# Patient Record
Sex: Female | Born: 2000 | Marital: Single
Health system: Southern US, Community
[De-identification: ages and names within clinical notes are randomized; demographics above are authoritative.]

---

## 2021-03-21 ENCOUNTER — Ambulatory Visit (INDEPENDENT_AMBULATORY_CARE_PROVIDER_SITE_OTHER): Payer: 59 | Admitting: Orthopedic Surgery

## 2021-03-21 DIAGNOSIS — S43431A Superior glenoid labrum lesion of right shoulder, initial encounter: Secondary | ICD-10-CM

## 2021-03-25 ENCOUNTER — Other Ambulatory Visit: Payer: No Typology Code available for payment source

## 2021-03-25 DIAGNOSIS — M25511 Pain in right shoulder: Secondary | ICD-10-CM

## 2021-03-27 ENCOUNTER — Encounter: Payer: Self-pay | Admitting: Orthopedic Surgery

## 2021-03-27 NOTE — Progress Notes (Signed)
   Post-Op Visit Note   Patient: Deborah Roy           Date of Birth: 2001-02-13           MRN: [id number] Visit Date: 03/21/2021 PCP: No primary care provider on file.   Assessment & Plan:  Chief Complaint: No chief complaint on file.  Visit Diagnoses:  1. 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  Injured her right shoulder about 3 months ago.  Developed pain without discrete injury.  Pain has been getting worse.  She describes new mechanical symptoms.  She is had pain with serving as well as recently when she has a forehand.  Takes ibuprofen without much relief.  Flat surface worsen her kicks her.  Impact hurts on the forehand.  Hard for her to sleep on the right-hand side.  On examination patient has positive O'Brien's test with a little grinding that she can do with voluntary contraction of some of the shoulder muscles.  Does not really seem like it is coming from the scapular region.  Rotator cuff strength is intact.  AC joint nontender.  Range of motion full and patient has negative apprehension and slightly less than a centimeter sulcus sign bilaterally.  Impression is probable right shoulder SLAP tear based primarily on history as well as how much is hurting her with her served in for him.  She wants to try to finish out the season.  We discussed symptomatic relief with intra-articular injection which she wants to hold off on.  We will get MRI arthrogram of the right shoulder to evaluate for labral tear.  I would say at this point activity as tolerated.  She has been playing with this for several months but it is getting worse.  See what the scan shows.  Follow-Up Instructions: No follow-ups on file.   Orders:  No orders of the defined types were placed in this encounter.  No orders of the defined types were placed in this encounter.   Imaging: No results found.  PMFS History: There are no problems  to display for this patient.  History reviewed. No pertinent past medical history.  History reviewed. No pertinent family history.  History reviewed. No pertinent surgical history. Social History   Occupational History  . Not on file  Tobacco Use  . Smoking status: Not on file  . Smokeless tobacco: Not on file  Substance and Sexual Activity  . Alcohol use: Not on file  . Drug use: Not on file  . Sexual activity: Not on file

## 2021-04-20 ENCOUNTER — Inpatient Hospital Stay: Admission: RE | Admit: 2021-04-20 | Payer: No Typology Code available for payment source | Source: Ambulatory Visit

## 2021-04-20 ENCOUNTER — Other Ambulatory Visit: Payer: No Typology Code available for payment source

## 2021-11-14 ENCOUNTER — Ambulatory Visit (INDEPENDENT_AMBULATORY_CARE_PROVIDER_SITE_OTHER): Payer: Self-pay | Admitting: Orthopedic Surgery

## 2021-11-14 DIAGNOSIS — S43431A Superior glenoid labrum lesion of right shoulder, initial encounter: Secondary | ICD-10-CM

## 2021-11-16 ENCOUNTER — Other Ambulatory Visit: Payer: Self-pay

## 2021-11-16 DIAGNOSIS — M25511 Pain in right shoulder: Secondary | ICD-10-CM

## 2021-11-16 NOTE — Progress Notes (Signed)
MR arthrogram shoulder ordered per Dr August Saucer

## 2021-11-19 ENCOUNTER — Encounter: Payer: Self-pay | Admitting: Orthopedic Surgery

## 2021-11-19 NOTE — Progress Notes (Signed)
   Post-Op Visit Note   Patient: Deborah Roy           Date of Birth: 2001/06/09           MRN: [id number] Visit Date: 11/14/2021 PCP: No primary care provider on file.   Assessment & Plan:  Chief Complaint: No chief complaint on file.  Visit Diagnoses:  1. 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  She has had shoulder pain for about a year and a half now.  Last season she was able to moderate the pain with activity modification and anti-inflammatory medication.  Got better in the summer with a less demanding playing schedule but recurred this fall.  Has pain with overhead motion including serving and overhead's.  For hands and back hands are generally okay for her.  She does report mechanical symptoms in the right shoulder.  Has difficulty with overhead weight lifting as well.  Is affecting her serve velocity.  She has had extensive course of rehabilitation as well as anti-inflammatory medication.  Presents now for further evaluation and management.  Plain radiographs are unremarkable for any bony pathology.  On examination she has full active and passive range of motion of the shoulder.  She does have a little bit of coarseness with labral load testing on the right compared to the left.  No radicular signs in that right arm.  O'Brien's testing equivocal on the right negative on the left.  No discrete AC joint tenderness with crossarm adduction.  No other masses lymphadenopathy or skin changes noted in that shoulder girdle region.  Impression is right shoulder SLAP tear with further progression of symptoms unresponsive to nonoperative management.  Plan is MRI arthrogram of the right shoulder to evaluate for posterior superior labral pathology.  Follow-up after that study.  Follow-Up Instructions: Return for after MRI.   Orders:  No orders of the defined types were placed in this  encounter.  No orders of the defined types were placed in this encounter.   Imaging: No results found.  PMFS History: There are no problems to display for this patient.  No past medical history on file.  No family history on file.  No past surgical history on file. Social History   Occupational History   Not on file  Tobacco Use   Smoking status: Not on file   Smokeless tobacco: Not on file  Substance and Sexual Activity   Alcohol use: Not on file   Drug use: Not on file   Sexual activity: Not on file

## 2021-12-23 ENCOUNTER — Other Ambulatory Visit: Payer: BC Managed Care – PPO

## 2021-12-23 ENCOUNTER — Inpatient Hospital Stay: Admission: RE | Admit: 2021-12-23 | Payer: BC Managed Care – PPO | Source: Ambulatory Visit

## 2021-12-28 ENCOUNTER — Ambulatory Visit: Payer: BC Managed Care – PPO | Admitting: Orthopedic Surgery

## 2021-12-29 ENCOUNTER — Ambulatory Visit
Admission: RE | Admit: 2021-12-29 | Discharge: 2021-12-29 | Disposition: A | Discharge status: Home / Self Care | Payer: BC Managed Care – PPO | Source: Ambulatory Visit | Attending: Orthopedic Surgery | Admitting: Orthopedic Surgery

## 2021-12-29 ENCOUNTER — Other Ambulatory Visit: Payer: Self-pay

## 2021-12-29 DIAGNOSIS — M25511 Pain in right shoulder: Secondary | ICD-10-CM

## 2021-12-29 IMAGING — MR MR SHOULDER*R* W/CM
4 of 6 series · 24 of 40 positions shown · IV contrast (agent unspecified)
Comparison: None.

CLINICAL DATA: Right sharp shoulder pain with overhead movements
for 6 months. Clinical concern superior labrum anterior and
posterior tear.

EXAM:
MR ARTHROGRAM OF THE RIGHT SHOULDER
TECHNIQUE: Multiplanar, multisequence MR imaging of the right shoulder was
performed following the administration of intra-articular contrast.
CONTRAST:  See Injection Documentation.

[Series 3: (id) fs · axial · 4.0mm · 0.27mm/px · z∈[-41,+51]mm · 7 of 20 slices shown]
[im 1/20]
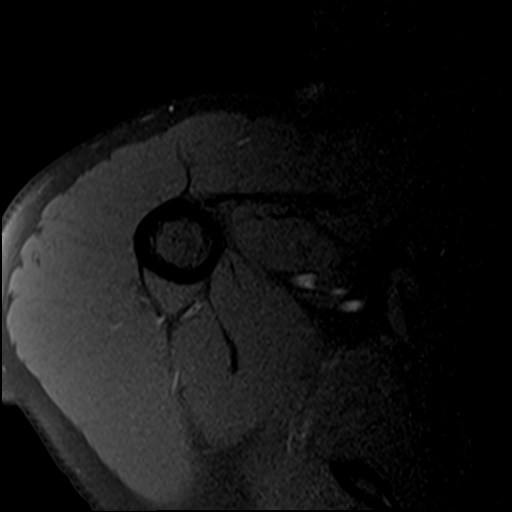
[im 4/20]
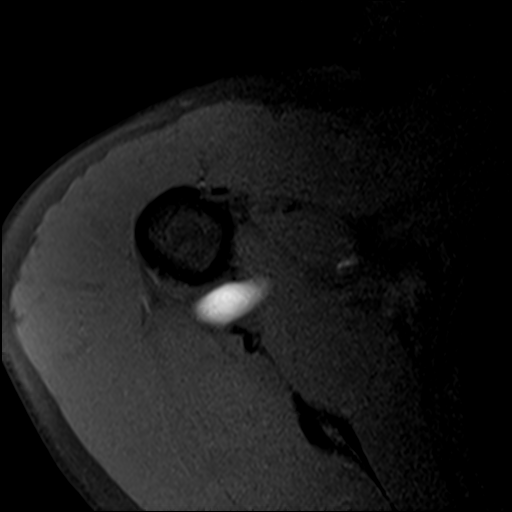
[im 7/20]
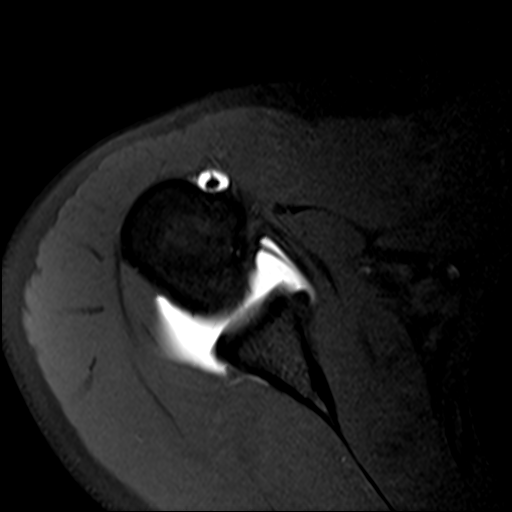
[im 10/20]
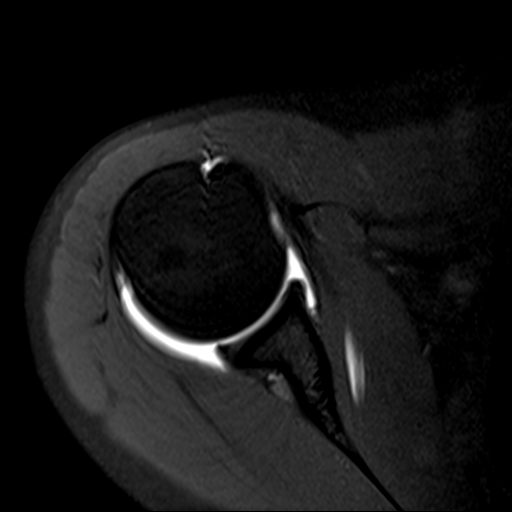
[im 13/20]
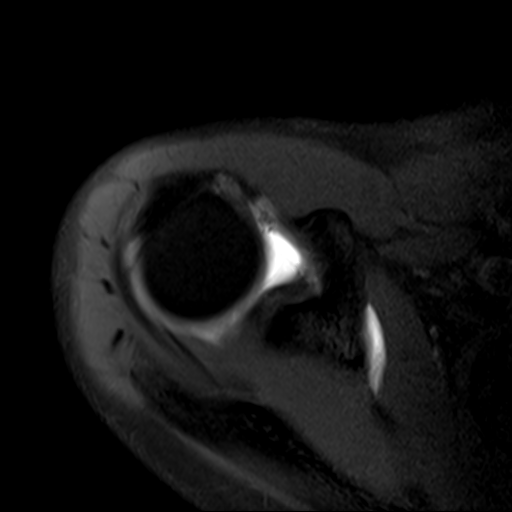
[im 16/20]
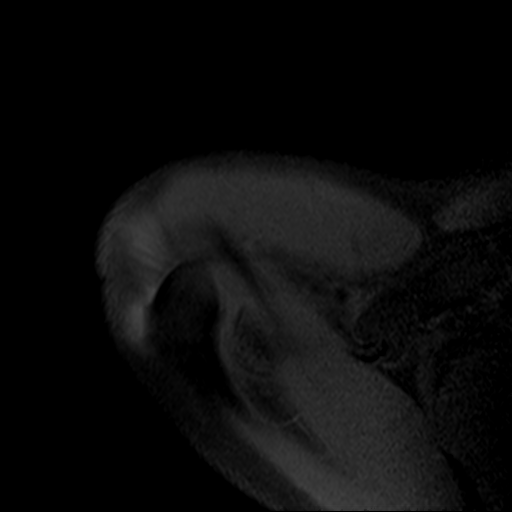
[im 20/20]
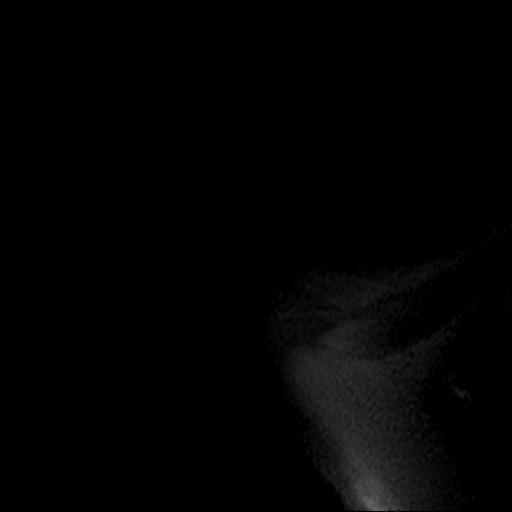

[Series 4: T1 fat-sat · sagittal · 4.0mm · 0.27mm/px · 6 of 16 slices shown]
[im 1/16]
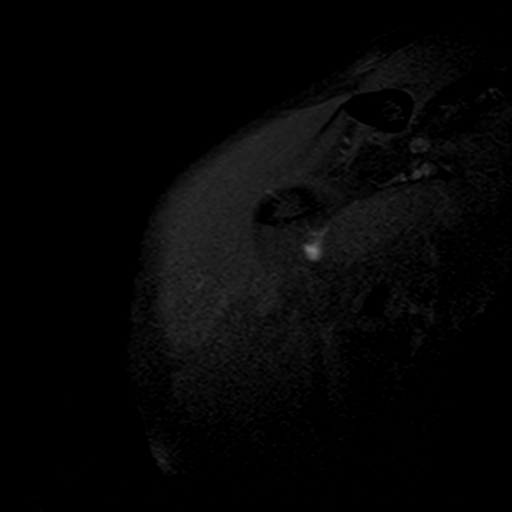
[im 4/16]
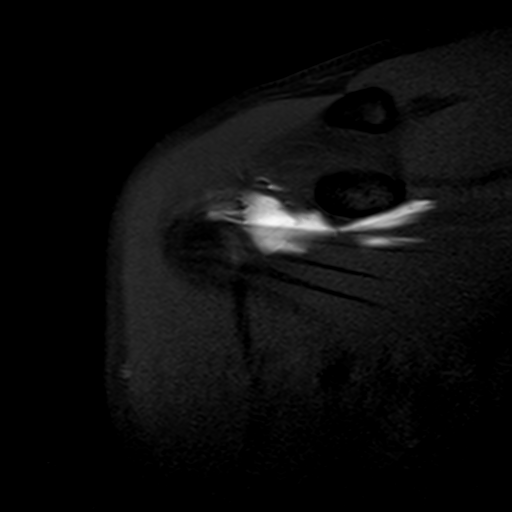
[im 7/16]
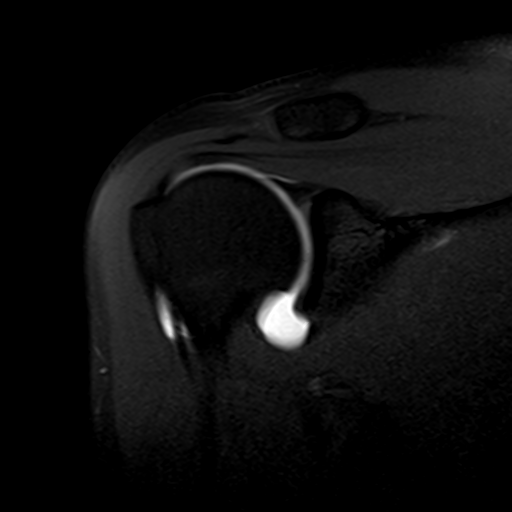
[im 10/16]
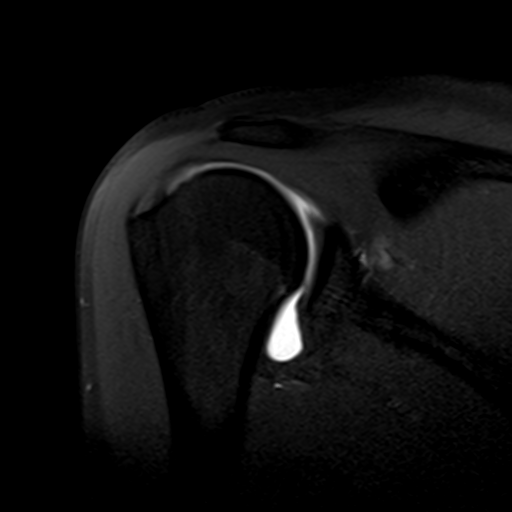
[im 13/16]
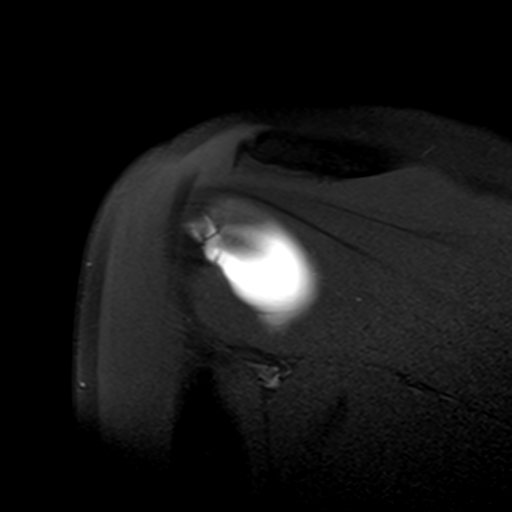
[im 16/16]
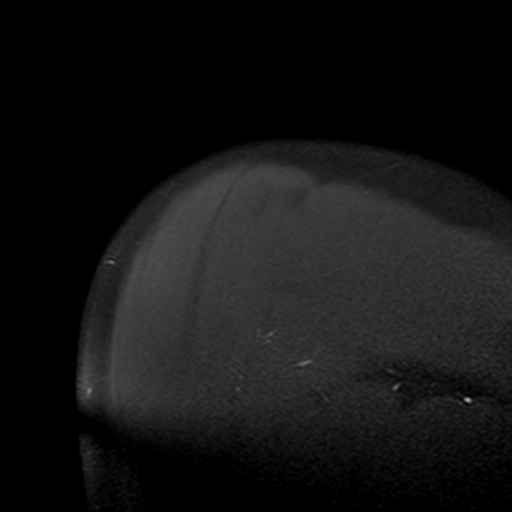

[Series 5: t2_tse_cor · sagittal · 4.0mm · 0.27mm/px · 4 of 17 slices shown]
[im 1/17]
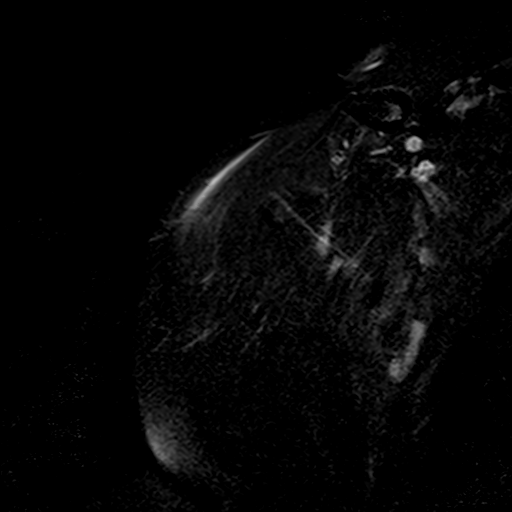
[im 3/17]
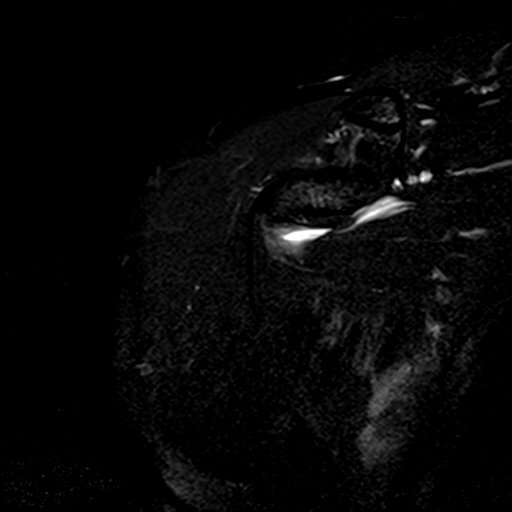
[im 9/17]
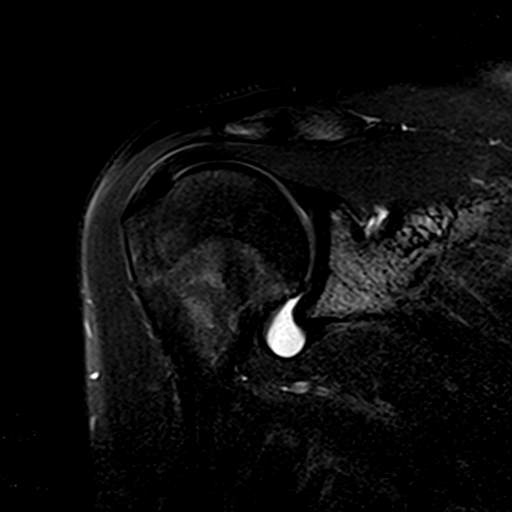
[im 14/17]
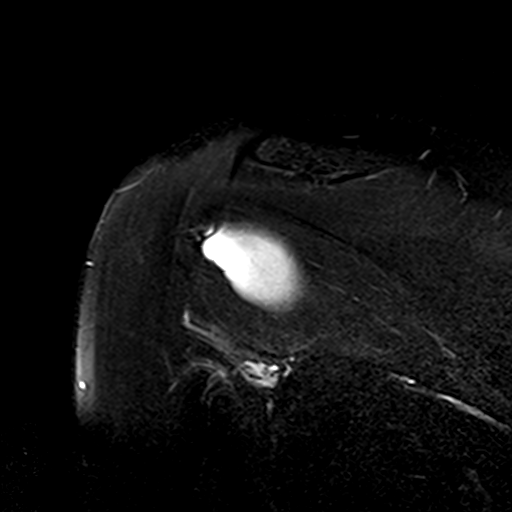

[Series 6: T1 · sagittal · 4.0mm · 0.55mm/px · 7 of 17 slices shown]
[im 1/17]
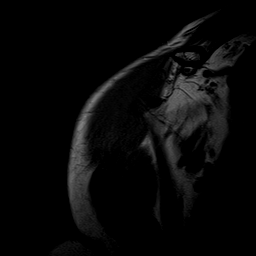
[im 3/17]
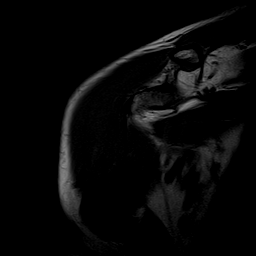
[im 6/17]
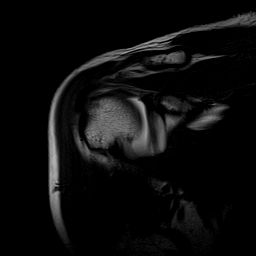
[im 9/17]
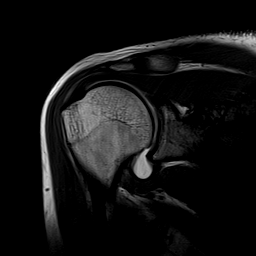
[im 11/17]
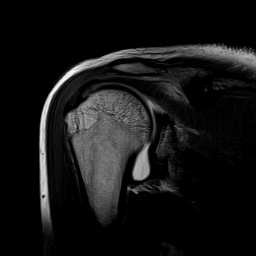
[im 14/17]
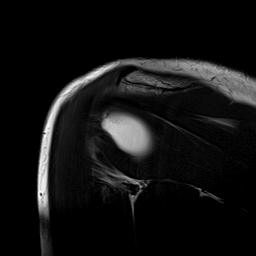
[im 17/17]
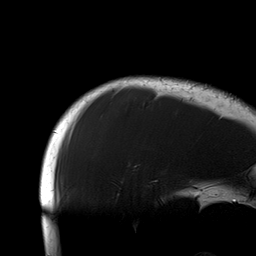

[24 of 40 positions shown; findings below may reference images not displayed]

FINDINGS: Rotator cuff: The supraspinatus, infraspinatus, subscapularis, and
teres minor tendons are intact.

Muscles: Preserved bulk and signal intensity of the rotator cuff
musculature without edema, atrophy, or fatty infiltration.

Biceps long head:  Intact and appropriately positioned.

Acromioclavicular Joint: Normal AC joint. Trace
subacromial-subdeltoid bursal fluid. No contrast extends into the
bursal space.

Glenohumeral Joint: Adequately distended with injected contrast.
Relatively patulous joint capsule. No cartilage defect.

Labrum:  No evidence of labral tear. No paralabral cyst.

Bones: No acute fracture. No dislocation. Normal appearance of the
glenoid without evidence of glenoid hypoplasia. No bone marrow
edema. No marrow replacing bone lesion.

Other: None.
IMPRESSION: 1. Essentially unremarkable MRI of the right shoulder. Intact
rotator cuff, biceps tendon, and labrum.
2. Trace subacromial-subdeltoid bursal fluid, which may reflect a
mild bursitis.
3. Relatively patulous joint capsule which may be secondary to
over-distension, although can be associated with glenohumeral
instability.

## 2021-12-29 MED ORDER — IOPAMIDOL (ISOVUE-M 200) INJECTION 41%
13.0000 mL | Freq: Once | INTRAMUSCULAR | Status: AC
  Administered 2021-12-29: 13 mL via INTRA_ARTICULAR

## 2021-12-31 NOTE — Progress Notes (Signed)
Sent info to trainer

## 2022-01-02 ENCOUNTER — Ambulatory Visit: Payer: BC Managed Care – PPO | Admitting: Orthopedic Surgery

## 2022-01-30 ENCOUNTER — Ambulatory Visit (INDEPENDENT_AMBULATORY_CARE_PROVIDER_SITE_OTHER): Payer: Self-pay | Admitting: Orthopedic Surgery

## 2022-01-30 DIAGNOSIS — M25571 Pain in right ankle and joints of right foot: Secondary | ICD-10-CM

## 2022-01-31 ENCOUNTER — Other Ambulatory Visit: Payer: Self-pay

## 2022-01-31 ENCOUNTER — Ambulatory Visit: Payer: Self-pay

## 2022-01-31 ENCOUNTER — Ambulatory Visit: Payer: BC Managed Care – PPO

## 2022-01-31 DIAGNOSIS — M79671 Pain in right foot: Secondary | ICD-10-CM

## 2022-01-31 LAB — VITAMIN D 25 HYDROXY (VIT D DEFICIENCY, FRACTURES): Vit D, 25-Hydroxy: 28 ng/mL — ABNORMAL LOW (ref 30–100)

## 2022-02-01 ENCOUNTER — Other Ambulatory Visit: Payer: Self-pay

## 2022-02-01 ENCOUNTER — Ambulatory Visit
Admission: RE | Admit: 2022-02-01 | Discharge: 2022-02-01 | Disposition: A | Discharge status: Home / Self Care | Payer: BC Managed Care – PPO | Source: Ambulatory Visit | Attending: Orthopedic Surgery | Admitting: Orthopedic Surgery

## 2022-02-01 DIAGNOSIS — M79671 Pain in right foot: Secondary | ICD-10-CM

## 2022-02-01 IMAGING — MR MR FOOT*R* W/O CM
5 series · 40 of 40 positions shown · non-contrast
Comparison: None.

CLINICAL DATA: Evaluate base of the second metatarsal fracture
versus Lisfranc ligament injury.

EXAM:
MRI OF THE RIGHT FOREFOOT WITHOUT CONTRAST
TECHNIQUE: Multiplanar, multisequence MR imaging of the right foot was
performed. No intravenous contrast was administered.

[Series 4: T1 · coronal · left · 3.0mm · 0.38mm/px · 12 of 54 slices shown (1 of 2)]
[im 1/54]
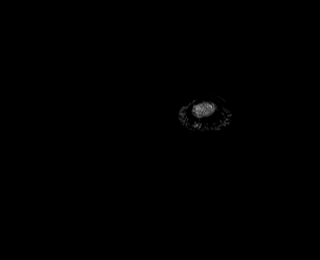
[im 5/54]
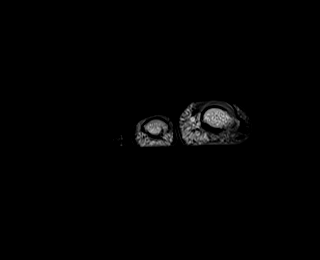
[im 10/54]
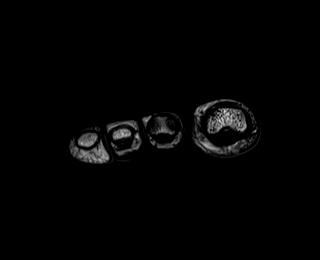
[im 15/54]
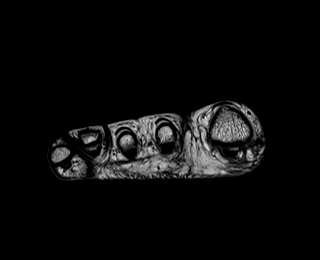
[im 20/54]
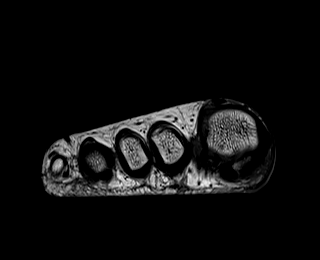
[im 25/54]
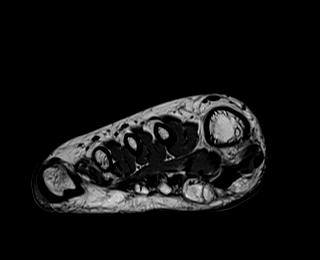
[im 29/54]
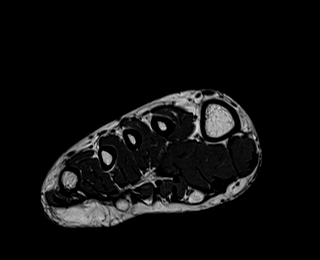
[im 34/54]
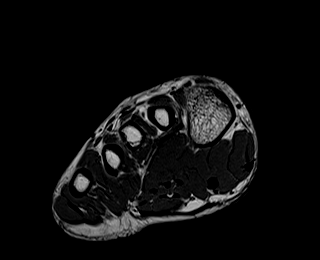
[im 39/54]
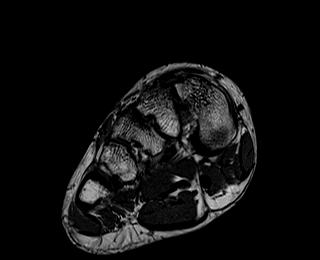
[im 44/54]
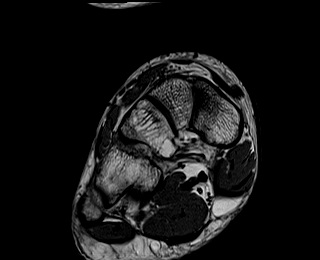
[im 49/54]
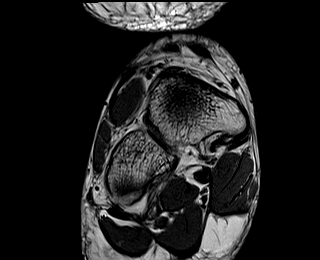
[im 54/54]
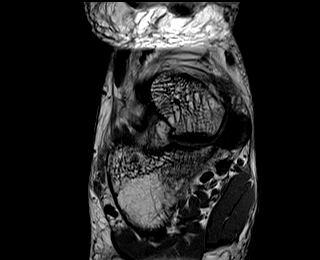

[Series 5: T2 fat-sat · coronal · left · 3.0mm · 0.38mm/px · 12 of 54 slices shown (1 of 2)]
[im 1/54]
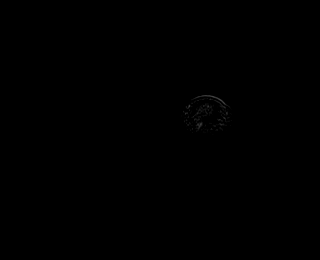
[im 5/54]
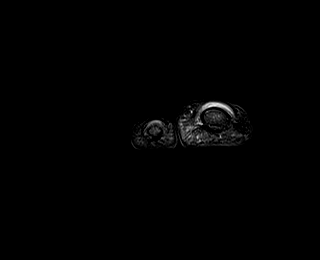
[im 10/54]
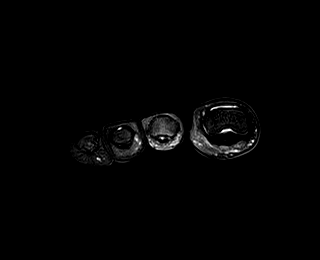
[im 15/54]
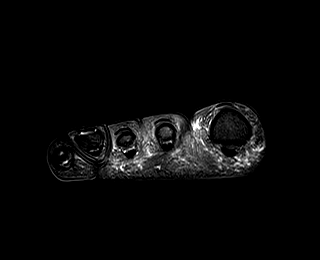
[im 20/54]
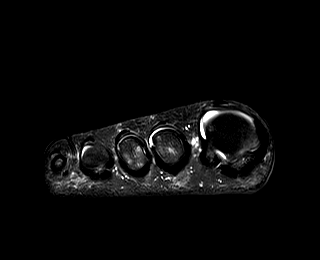
[im 25/54]
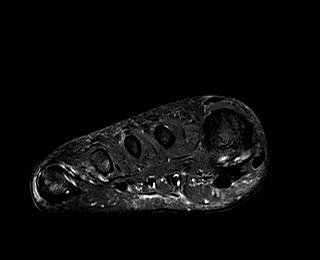
[im 29/54]
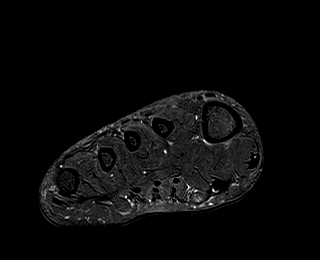
[im 34/54]
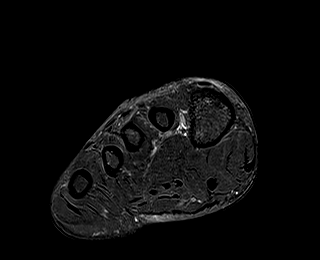
[im 39/54]
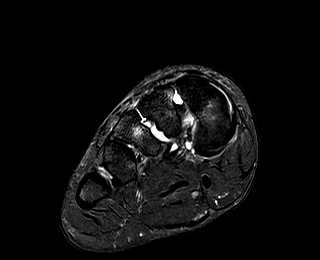
[im 44/54]
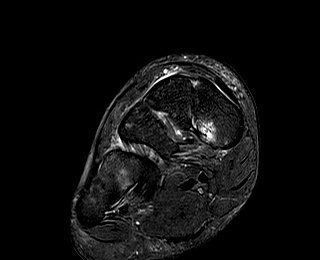
[im 49/54]
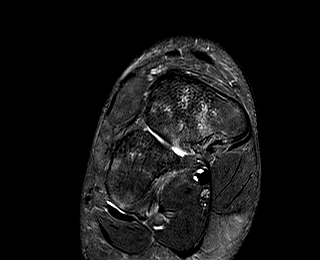
[im 54/54]
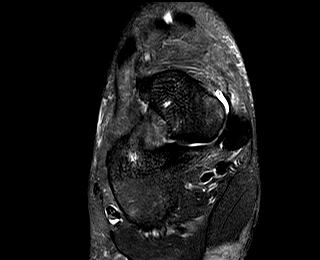

[Series 7: STIR · sagittal · left · 3.0mm · 0.78mm/px · 6 of 29 slices shown]
[im 1/29]
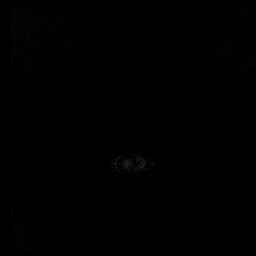
[im 6/29]
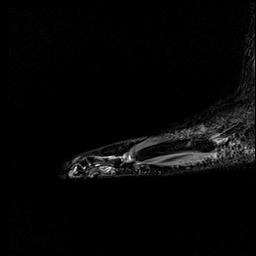
[im 12/29]
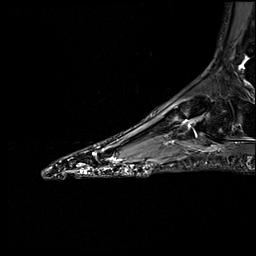
[im 17/29]
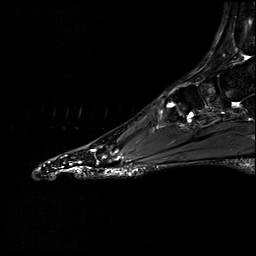
[im 23/29]
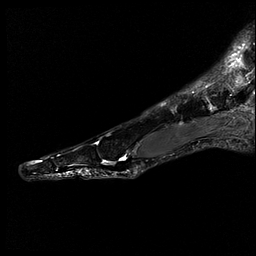
[im 29/29]
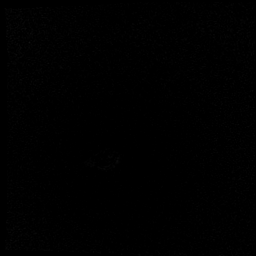

[Series 8: T1 · axial · left · 3.0mm · 0.49mm/px · z∈[-155,-77]mm · 5 of 23 slices shown (2 of 2)]
[im 1/23]
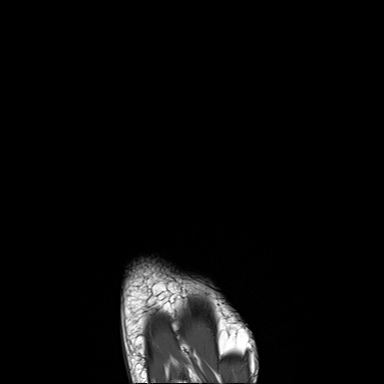
[im 6/23]
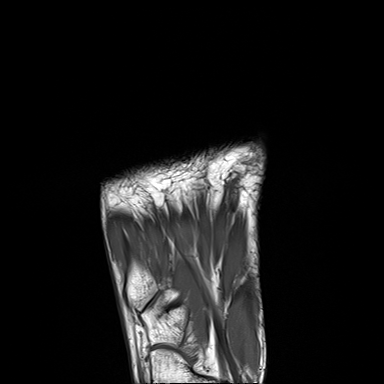
[im 12/23]
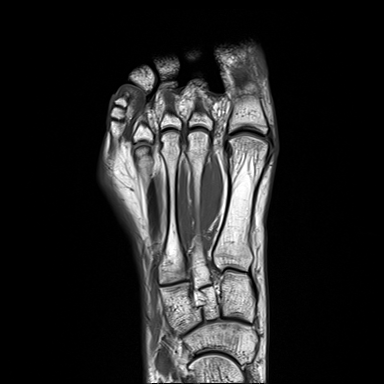
[im 17/23]
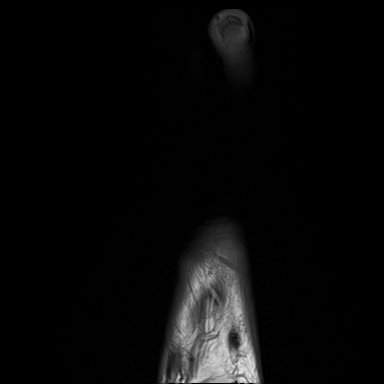
[im 23/23]
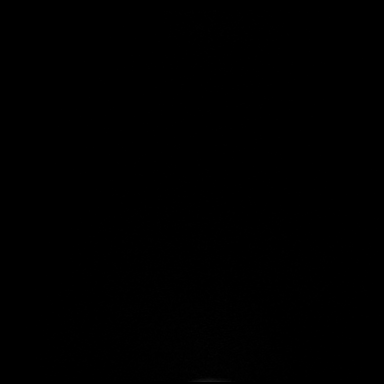

[Series 9: T2 fat-sat · axial · left · 3.0mm · 0.49mm/px · z∈[-155,-77]mm · 5 of 23 slices shown (2 of 2)]
[im 1/23]
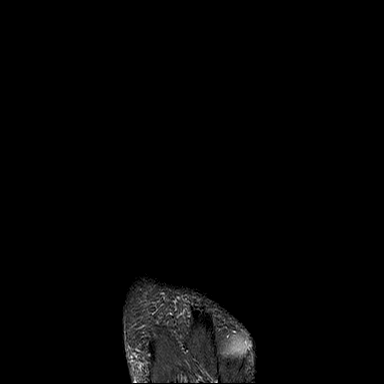
[im 6/23]
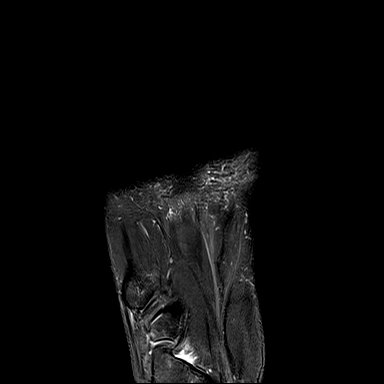
[im 12/23]
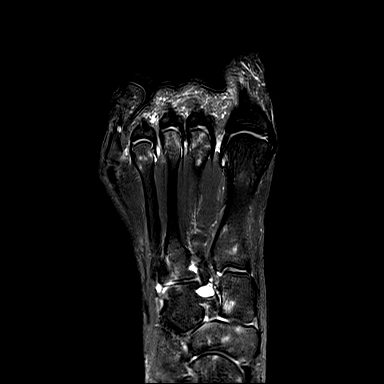
[im 17/23]
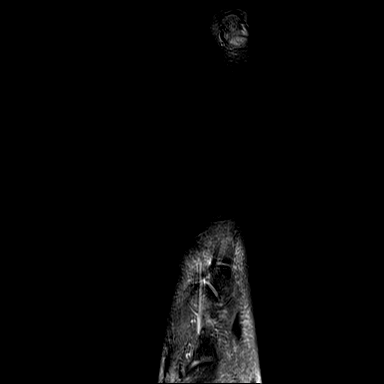
[im 23/23]
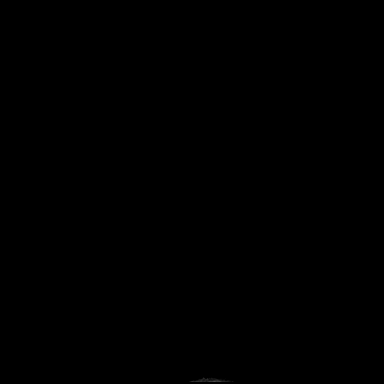

[40 of 40 positions shown; findings below may reference images not displayed]

FINDINGS: Bones/Joint/Cartilage

There are multiple small scattered foci of signal abnormality on T2
and STIR sequences in the distal talus, navicular, cuneiforms and
cuboid as well as in the proximal first metatarsal. There is also
heterogeneous marrow signal in the heads of the [NA] metatarsals.
There is also bone marrow edema about the proximal interphalangeal
joint of the second digit. Normal alignment. No joint effusion.

Ligaments

Collateral ligaments are intact. Lisfranc ligament is intact (series
9, image 10; series 5, image 39). There is trace amount of fluid in
the joints between the tarsals and metatarsals.

Muscles and Tendons

Flexor, peroneal and extensor compartment tendons are intact.
Muscles are normal.

Soft tissue
No fluid collection or hematoma. Mild subcutaneous soft tissue edema
about the proximal interphalangeal joint of the second digit.
IMPRESSION: 1.  Lisfranc ligament is intact.

2. Heterogeneous marrow signal with multiple scattered small foci of
marrow edema involving the talus, navicular, cuneiform bones,
cuboid, proximal first metatarsal as well as distal [NA]
metatarsals. There is also focal marrow edema about the proximal
interphalangeal joint of the second digit. This is concerning for
bone contusion or stress related injury. Clinical correlation is
suggested. No displaced fracture.

## 2022-02-01 MED ORDER — VITAMIN D (ERGOCALCIFEROL) 1.25 MG (50000 UNIT) PO CAPS
50000.0000 [IU] | ORAL_CAPSULE | ORAL | 0 refills | Status: AC
Start: 2022-02-01 — End: ?

## 2022-02-01 NOTE — Progress Notes (Signed)
Pls send in vit d 50 k units q week for 8 weeks thx

## 2022-02-04 ENCOUNTER — Encounter: Payer: Self-pay | Admitting: Orthopedic Surgery

## 2022-02-04 NOTE — Progress Notes (Signed)
° °  Post-Op Visit Note   Patient: Deborah Roy           Date of Birth: 06/26/2001           MRN: [id number] Visit Date: 01/30/2022 PCP: Pcp, No   Assessment & Plan:  Chief Complaint: No chief complaint on file.  Visit Diagnoses:  1. 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  She landed on her right foot and had bad pain at first but she was able to complete her match.  Does give her some pain with every step.  Today is the first day she has not had practice.  On examination she does have much swelling at the base of the second metatarsal but does have pain.  Not too much pain with pronation supination of the forefoot.  Toe to flexion extension intact.  Ankle dorsiflexion plantarflexion intact.  Fluoroscopic examination suggest possible linear density around the base of the second metatarsal.  No widening of that 1 2 metatarsal base interspace.  Plan at this time is MRI scan right foot to evaluate stress reaction or fracture of the base of the second metatarsal versus Lisfranc ligament injury.  We can see her back after the study and training room.  Follow-Up Instructions: No follow-ups on file.   Orders:  No orders of the defined types were placed in this encounter.  No orders of the defined types were placed in this encounter.   Imaging: No results found.  PMFS History: There are no problems to display for this patient.  No past medical history on file.  No family history on file.  No past surgical history on file. Social History   Occupational History   Not on file  Tobacco Use   Smoking status: Not on file   Smokeless tobacco: Not on file  Substance and Sexual Activity   Alcohol use: Not on file   Drug use: Not on file   Sexual activity: Not on file

## 2022-02-06 ENCOUNTER — Ambulatory Visit (INDEPENDENT_AMBULATORY_CARE_PROVIDER_SITE_OTHER): Payer: Self-pay | Admitting: Orthopedic Surgery

## 2022-02-06 DIAGNOSIS — M79671 Pain in right foot: Secondary | ICD-10-CM

## 2022-02-07 ENCOUNTER — Encounter: Payer: Self-pay | Admitting: Orthopedic Surgery

## 2022-02-07 ENCOUNTER — Other Ambulatory Visit: Payer: Self-pay

## 2022-02-07 NOTE — Progress Notes (Signed)
° °  Post-Op Visit Note   Patient: Deborah Roy           Date of Birth: 25-Apr-2001           MRN: [id number] Visit Date: 02/06/2022 PCP: Pcp, No   Assessment & Plan:  Chief Complaint: No chief complaint on file.  Visit Diagnoses: No diagnosis found.  Plan: Talk to Clydie Braun the trainer about Andi Hence today.  She is feeling better.  We are going to keep her in the boot for about 10 days.  Had some patchy edema and low vitamin D which is being treated.  Anticipate return to play sometime next week.  She will likely be out this coming Thursday and Saturday from match play.  Follow-Up Instructions: No follow-ups on file.   Orders:  No orders of the defined types were placed in this encounter.  No orders of the defined types were placed in this encounter.   Imaging: No results found.  PMFS History: There are no problems to display for this patient.  No past medical history on file.  No family history on file.  No past surgical history on file. Social History   Occupational History   Not on file  Tobacco Use   Smoking status: Not on file   Smokeless tobacco: Not on file  Substance and Sexual Activity   Alcohol use: Not on file   Drug use: Not on file   Sexual activity: Not on file

## 2022-02-13 ENCOUNTER — Ambulatory Visit (INDEPENDENT_AMBULATORY_CARE_PROVIDER_SITE_OTHER): Payer: Self-pay | Admitting: Orthopedic Surgery

## 2022-02-13 DIAGNOSIS — M79671 Pain in right foot: Secondary | ICD-10-CM

## 2022-02-15 ENCOUNTER — Other Ambulatory Visit: Payer: Self-pay

## 2022-02-20 ENCOUNTER — Encounter: Payer: Self-pay | Admitting: Orthopedic Surgery

## 2022-02-20 NOTE — Progress Notes (Signed)
° °  Post-Op Visit Note   Patient: Deborah Roy           Date of Birth: [birth year]           MRN: [id number] Visit Date: 02/13/2022 PCP: Pcp, No   Assessment & Plan:  Chief Complaint: No chief complaint on file.  Visit Diagnoses:  1. Pain in right foot     Plan: Deborah Roy is a 21 year old patient with right foot pain.  She has been going on a walking boot.  She does report a little bit of pain after walking without the fracture boot.  Mostly dorsal sided pain with regular shoes.  MRI scan shows no injury to the Lisfranc ligament.  Scattered areas of bone contusion versus stress reaction.  Plan at this time is to continue with oral vitamin D.  Do not really envision her being able to play tennis if it hurts her after 1 afternoon of walking in a regular shoe.  May take a little bit more time to improve.  We can assess her in 7 days.  I do think it would be okay for her to start trying to wean herself out of the fracture boot to see if she can increase her functional time in normal shoes.  When she is able to run jump cut and pivot without pain we can get her back into the line up.  Follow-Up Instructions: No follow-ups on file.   Orders:  No orders of the defined types were placed in this encounter.  No orders of the defined types were placed in this encounter.   Imaging: No results found.  PMFS History: There are no problems to display for this patient.  No past medical history on file.  No family history on file.  No past surgical history on file. Social History   Occupational History   Not on file  Tobacco Use   Smoking status: Not on file   Smokeless tobacco: Not on file  Substance and Sexual Activity   Alcohol use: Not on file   Drug use: Not on file   Sexual activity: Not on file

## 2022-03-28 ENCOUNTER — Other Ambulatory Visit: Payer: Self-pay | Admitting: Orthopedic Surgery

## 2024-03-24 ENCOUNTER — Encounter: Payer: Self-pay | Admitting: Sports Medicine

## 2024-03-24 ENCOUNTER — Ambulatory Visit: Admitting: Sports Medicine

## 2024-03-24 ENCOUNTER — Other Ambulatory Visit: Payer: Self-pay

## 2024-03-24 ENCOUNTER — Other Ambulatory Visit (INDEPENDENT_AMBULATORY_CARE_PROVIDER_SITE_OTHER): Payer: Self-pay

## 2024-03-24 DIAGNOSIS — M79602 Pain in left arm: Secondary | ICD-10-CM

## 2024-03-24 MED ORDER — METHYLPREDNISOLONE 4 MG PO TBPK: ORAL_TABLET | ORAL | 0 refills | Status: AC

## 2024-03-24 NOTE — Progress Notes (Signed)
 Patient says that she has pain in her left forearm that has gotten progressively worse over the last month. Her pain is more so over the mid-to-distal ulna. She denies any pain in the wrist/hand, or elbow/shoulder. She says that she did not have any known MOI. She is right-handed and plays tennis with her right hand; she has pain with her backhand when she plays. She says that after playing she will have increased pain that will disrupt her sleep; Ibuprofen does help which she takes only as needed. She denies any numbness or tingling, aside from lingering numbness that she felt for a few days after using ice on the arm after a match. Patient can perform active ROM but does notice discomfort with wrist extension and ulnar deviation.

## 2024-03-24 NOTE — Progress Notes (Signed)
 Deborah Roy - 23 y.o. female MRN [id number]  Date of birth: 2001/01/08  Office Visit Note: Visit Date: 03/24/2024 PCP: Pcp, No Referred by: No ref. provider found  Subjective: Chief Complaint  Patient presents with   Left Wrist - Pain   HPI: Deborah Roy is a pleasant 23 y.o. female who presents today for left forearm pain. She is RHD. She is a Warehouse manager.  She has had progressive pain over the ulnar side of the mid to distal forearm.  This started about 1 month ago, no inciting event.  She continues to play through tennis although has had to pull back recently.  She is right-hand dominant, and does not have pain all the time but with certain motion she will feel a sharp pain over the lateral side of the forearm/wrist.  Pain with backhand hitting in tennis.  She is taking ibuprofen occasionally which is helpful.  She denies any numbness tingling or burning sensation. There will be 1 motion where she pushes her hand into radial deviation where she has pain but otherwise has good range of motion about the wrist and hand without pain.  Pertinent ROS were reviewed with the patient and found to be negative unless otherwise specified above in HPI.   Assessment & Plan: Visit Diagnoses:  1. Left arm pain    Plan: Impression is 1 month of progressive left ulnar forearm and wrist pain.  X-rays and ultrasound do not show any significant abnormal pathology.  She does have some mild inflammation surrounding the interosseous membrane of the ulna, possibly suggestive of overuse versus tendinitis.  She has no evidence of ECU tendon abnormality or TFCC abnormality.  She has no pain over the radial forearm ruling out PIN syndrome, Radial Tunnel, and negative symptoms for Wartenberg syndrome today.  he does have 2 upcoming tournaments left in her tennis career, discussed that MRI would likely not be of benefit given this at the time.  I would like to start her on a 6-day  Medrol Dosepak and gauge her response to this.  If this is more overuse/tendinitis, I would expect her to get rather good relief from this during this course.  She will message her ATC and contact me at the end of this treatment to gauge her response.  I am okay with taping/KT tape to help stabilize during game play.  She will play allowing pain to be her guide but no gross restrictions from my standpoint.  Follow-up: Return in about 1 week (around 03/31/2024), or if symptoms worsen or fail to improve, for notify me/ATC.   Meds & Orders:  Meds ordered this encounter  Medications   methylPREDNISolone (MEDROL DOSEPAK) 4 MG TBPK tablet    Sig: Take per packet instruction. Taper dosing.    Dispense:  1 each    Refill:  0    Orders Placed This Encounter  Procedures   [place] Extrem Up Left Ltd   XR Forearm Left     Procedures: No procedures performed      Clinical History: No specialty comments available.  She has no history on file for tobacco use. No results for input(s): "HGBA1C", "LABURIC" in the last 8760 hours.  Objective:    Physical Exam  Gen: Well-appearing, in no acute distress; non-toxic CV: Well-perfused. Warm.  Resp: Breathing unlabored on room air; no wheezing. Psych: Fluid speech in conversation; appropriate affect; normal thought process  Ortho Exam - Left arm/wrist: There is no significant bony tenderness  or tenderness palpating about the wrist joint.  Negative ECU Synergy test, negative fovea sign.  There is no redness swelling or effusion.  There is full range of motion about the wrist in all directions.  There is mild reproduction of pain with interosseous squeeze test.  Negative Tinel's over the ulnar nerve distribution.  Imaging: [place] Extrem Up Left Ltd Result Date: 03/24/2024 Limited musculoskeletal ultrasound of the forearm and ulnar-sided wrist was performed today.  The ECU tendon was seen sitting within the ulnar groove without evidence of tendon tearing or  tenosynovitis.  The TFCC region was evaluated without gross evidence of tearing, although incompletely visualized.  The ulna was seen in short axis followed from the proximal to distal and without bony abnormality.  There is mild hyperemia with inflammation near the surrounding musculature in the interosseous membrane of the radius to the ulna.  XR Forearm Left Result Date: 03/24/2024 2 views of the left forearm including AP and lateral of them were ordered and reviewed by myself.  No acute osseous abnormalities noted of the radius or ulna.   Past Medical/Family/Surgical/Social History: Medications & Allergies reviewed per EMR, new medications updated. There are no active problems to display for this patient.  No past medical history on file. No family history on file. No past surgical history on file. Social History   Occupational History   Not on file  Tobacco Use   Smoking status: Not on file   Smokeless tobacco: Not on file  Substance and Sexual Activity   Alcohol use: Not on file   Drug use: Not on file   Sexual activity: Not on file

## 2024-03-25 ENCOUNTER — Ambulatory Visit: Admitting: Sports Medicine

## 2024-10-20 ENCOUNTER — Encounter: Payer: Self-pay | Admitting: Radiology
